# Patient Record
Sex: Male | Born: 1990 | Race: White | Hispanic: No | Marital: Single | Smoking: Never smoker
Health system: Southern US, Community
[De-identification: ages and names within clinical notes are randomized; demographics above are authoritative.]

## PROBLEM LIST (undated history)

## (undated) ENCOUNTER — Emergency Department (HOSPITAL_BASED_OUTPATIENT_CLINIC_OR_DEPARTMENT_OTHER): Payer: BLUE CROSS/BLUE SHIELD | Source: Home / Self Care

---
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
## 2016-03-10 ENCOUNTER — Encounter: Payer: Self-pay | Admitting: Podiatry

## 2016-03-10 ENCOUNTER — Ambulatory Visit (INDEPENDENT_AMBULATORY_CARE_PROVIDER_SITE_OTHER): Payer: Commercial Managed Care - HMO | Admitting: Podiatry

## 2016-03-10 ENCOUNTER — Ambulatory Visit (INDEPENDENT_AMBULATORY_CARE_PROVIDER_SITE_OTHER): Payer: Commercial Managed Care - HMO

## 2016-03-10 VITALS — BP 122/79 | HR 72 | Resp 16 | Ht 67.0 in | Wt 210.0 lb

## 2016-03-10 DIAGNOSIS — M79671 Pain in right foot: Secondary | ICD-10-CM

## 2016-03-10 DIAGNOSIS — M779 Enthesopathy, unspecified: Secondary | ICD-10-CM

## 2016-03-10 DIAGNOSIS — L84 Corns and callosities: Secondary | ICD-10-CM | POA: Diagnosis not present

## 2016-03-10 MED ORDER — TRIAMCINOLONE ACETONIDE 10 MG/ML IJ SUSP
10.0000 mg | Freq: Once | INTRAMUSCULAR | Status: AC
  Administered 2016-03-10: 10 mg

## 2016-03-10 NOTE — Progress Notes (Signed)
   Subjective:    Patient ID: Eric Shepard, male    DOB: 1991/02/09, 25 y.o.   MRN: [id number]  HPI Chief Complaint  Patient presents with  . Foot Pain    Right foot; lateral side; pt stated, "Slipped off a rock 2 weeks ago and hit side of foot"  . Painful lesions    Right foot; lateral side (below 5th toe); & plantar forefoot-below 2nd toe; pt stated, "Was dianosed years ago with having a sweat gland"      Review of Systems  All other systems reviewed and are negative.      Objective:   Physical Exam        Assessment & Plan:

## 2016-03-13 NOTE — Progress Notes (Signed)
Subjective:     Patient ID: Eric HartChristopher P Vanduyne, male   DOB: 08-24-1991, 25 y.o.   MRN: [id number]  HPI patient points with a painful lesion on the plantar aspect of the right fifth metatarsal with inflammation noted and fluid buildup around it. It is swollen and it is sore when pressed and at times makes it hard to walk. Has only become flared up recently again   Review of Systems  All other systems reviewed and are negative.      Objective:   Physical Exam  Constitutional: He is oriented to person, place, and time.  Cardiovascular: Intact distal pulses.   Musculoskeletal: Normal range of motion.  Neurological: He is oriented to person, place, and time.  Skin: Skin is warm.  Nursing note and vitals reviewed.  neurovascular status intact muscle strength adequate range of motion within normal limits with patient found to have inflammation and fluid around the fifth metatarsal head right that's painful when pressed and makes walking difficult. Patient states that this is been ongoing for the last few weeks and is had history of this problem. Patient's found to have good digital perfusion and is well oriented 3     Assessment:     Inflammatory capsulitis fifth MPJ right with pain and keratotic lesion formation    Plan:     H&P and x-rays reviewed with patient. Today I did careful injection around the fifth MPJ 3 mg Kenalog 5 mg Xylocaine and then after effective numbness debrided the lesion fully and applied sterile dressing. Reappoint to recheck as symptoms indicate  X-rays did not indicate any signs stress fracture arthritis with moderate plantar flexion fifth metatarsal right

## 2016-04-23 ENCOUNTER — Emergency Department (HOSPITAL_BASED_OUTPATIENT_CLINIC_OR_DEPARTMENT_OTHER)
Admission: EM | Admit: 2016-04-23 | Discharge: 2016-04-23 | Disposition: A | Discharge status: Home / Self Care | Payer: Commercial Managed Care - HMO | Attending: Emergency Medicine | Admitting: Emergency Medicine

## 2016-04-23 ENCOUNTER — Encounter (HOSPITAL_BASED_OUTPATIENT_CLINIC_OR_DEPARTMENT_OTHER): Payer: Self-pay | Admitting: Emergency Medicine

## 2016-04-23 DIAGNOSIS — Y999 Unspecified external cause status: Secondary | ICD-10-CM | POA: Diagnosis not present

## 2016-04-23 DIAGNOSIS — Y9389 Activity, other specified: Secondary | ICD-10-CM | POA: Insufficient documentation

## 2016-04-23 DIAGNOSIS — W57XXXA Bitten or stung by nonvenomous insect and other nonvenomous arthropods, initial encounter: Secondary | ICD-10-CM | POA: Insufficient documentation

## 2016-04-23 DIAGNOSIS — Y929 Unspecified place or not applicable: Secondary | ICD-10-CM | POA: Insufficient documentation

## 2016-04-23 DIAGNOSIS — S30860A Insect bite (nonvenomous) of lower back and pelvis, initial encounter: Secondary | ICD-10-CM | POA: Insufficient documentation

## 2016-04-23 MED ORDER — DOXYCYCLINE HYCLATE 100 MG PO TABS
200.0000 mg | ORAL_TABLET | Freq: Once | ORAL | Status: AC
  Administered 2016-04-23: 200 mg via ORAL
  Filled 2016-04-23: qty 2

## 2016-04-23 NOTE — Discharge Instructions (Signed)
You had a tick removed from your back today. The area did not look infected. If you develop any symptoms such as fatigue, headache, fevers, body aches, rashes please return to the emergency department or see your primary care provider. You were given doxycycline today in hopes of preventing any tickborne illness. Please return to emergency department if you develop any new or worsening symptoms.   Tick Bite Information Ticks are insects that attach themselves to the skin and draw blood for food. There are various types of ticks. Common types include wood ticks and deer ticks. Most ticks live in shrubs and grassy areas. Ticks can climb onto your body when you make contact with leaves or grass where the tick is waiting. The most common places on the body for ticks to attach themselves are the scalp, neck, armpits, waist, and groin. Most tick bites are harmless, but sometimes ticks carry germs that cause diseases. These germs can be spread to a person during the tick's feeding process. The chance of a disease spreading through a tick bite depends on:   The type of tick.  Time of year.   How long the tick is attached.   Geographic location.  HOW CAN YOU PREVENT TICK BITES? Take these steps to help prevent tick bites when you are outdoors:  Wear protective clothing. Long sleeves and long pants are best.   Wear white clothes so you can see ticks more easily.  Tuck your pant legs into your socks.   If walking on a trail, stay in the middle of the trail to avoid brushing against bushes.  Avoid walking through areas with long grass.  Put insect repellent on all exposed skin and along boot tops, pant legs, and sleeve cuffs.   Check clothing, hair, and skin repeatedly and before going inside.   Brush off any ticks that are not attached.  Take a shower or bath as soon as possible after being outdoors.  WHAT IS THE PROPER WAY TO REMOVE A TICK? Ticks should be removed as soon as  possible to help prevent diseases caused by tick bites. 1. If latex gloves are available, put them on before trying to remove a tick.  2. Using fine-point tweezers, grasp the tick as close to the skin as possible. You may also use curved forceps or a tick removal tool. Grasp the tick as close to its head as possible. Avoid grasping the tick on its body. 3. Pull gently with steady upward pressure until the tick lets go. Do not twist the tick or jerk it suddenly. This may break off the tick's head or mouth parts. 4. Do not squeeze or crush the tick's body. This could force disease-carrying fluids from the tick into your body.  5. After the tick is removed, wash the bite area and your hands with soap and water or other disinfectant such as alcohol. 6. Apply a small amount of antiseptic cream or ointment to the bite site.  7. Wash and disinfect any instruments that were used.  Do not try to remove a tick by applying a hot match, petroleum jelly, or fingernail polish to the tick. These methods do not work and may increase the chances of disease being spread from the tick bite.  WHEN SHOULD YOU SEEK MEDICAL CARE? Contact your health care provider if you are unable to remove a tick from your skin or if a part of the tick breaks off and is stuck in the skin.  After a tick bite,  you need to be aware of signs and symptoms that could be related to diseases spread by ticks. Contact your health care provider if you develop any of the following in the days or weeks after the tick bite:  Unexplained fever.  Rash. A circular rash that appears days or weeks after the tick bite may indicate the possibility of Lyme disease. The rash may resemble a target with a bull's-eye and may occur at a different part of your body than the tick bite.  Redness and swelling in the area of the tick bite.   Tender, swollen lymph glands.   Diarrhea.   Weight loss.   Cough.   Fatigue.   Muscle, joint, or bone pain.    Abdominal pain.   Headache.   Lethargy or a change in your level of consciousness.  Difficulty walking or moving your legs.   Numbness in the legs.   Paralysis.  Shortness of breath.   Confusion.   Repeated vomiting.    This information is not intended to replace advice given to you by your health care provider. Make sure you discuss any questions you have with your health care provider.   Document Released: 10/27/2000 Document Revised: 11/20/2014 Document Reviewed: 04/09/2013 Elsevier Interactive Patient Education [employer]

## 2016-04-23 NOTE — ED Provider Notes (Signed)
CSN: [id number]     Arrival date & time 04/23/16  1814 History   First MD Initiated Contact with Patient 04/23/16 1844     Chief Complaint  Patient presents with  . Tick Removal     (Consider location/radiation/quality/duration/timing/severity/associated sxs/prior Treatment) HPI Comments: Patient is a previously healthy 25 year old male who presents for tick removal. Patient stated he noticed a tick on his back today. Patient believes the tick has been on him since yesterday because he was working outside. The tick was engorged. His mother tried to pull out the tick with tweezers and was able to removed the majority except a remaining mouth part that will not come out. Patient denies pain to the area. Patient denies fever, chills, fatigue, chest pain, shortness of breath, abdominal pain, back pain, dysuria, rashes, or headaches.   The history is provided by the patient and a parent.    History reviewed. No pertinent past medical history. History reviewed. No pertinent past surgical history. History reviewed. No pertinent family history. Social History  Substance Use Topics  . Smoking status: Never Smoker   . Smokeless tobacco: None  . Alcohol Use: None    Review of Systems  Constitutional: Negative for fever, chills and fatigue.  HENT: Negative for facial swelling and sore throat.   Respiratory: Negative for shortness of breath.   Cardiovascular: Negative for chest pain.  Gastrointestinal: Negative for nausea, vomiting and abdominal pain.  Genitourinary: Negative for dysuria.  Musculoskeletal: Negative for back pain.  Skin: Positive for wound. Negative for rash.  Neurological: Negative for headaches.  Psychiatric/Behavioral: The patient is not nervous/anxious.       Allergies  Review of patient's allergies indicates no known allergies.  Home Medications   Prior to Admission medications   Medication Sig Start Date End Date Taking? Authorizing Provider  loratadine  (CLARITIN) 10 MG tablet  08/27/13   Historical Provider, MD   BP 123/89 mmHg  Pulse 76  Temp(Src) 97.9 F (36.6 C) (Oral)  Resp 18  Ht 5\' 7"  (1.702 m)  Wt 96.163 kg  BMI 33.20 kg/m2  SpO2 97% Physical Exam  Constitutional: He appears well-developed and well-nourished. No distress.  HENT:  Head: Normocephalic and atraumatic.  Mouth/Throat: Oropharynx is clear and moist. No oropharyngeal exudate.  Eyes: Conjunctivae are normal. Pupils are equal, round, and reactive to light. Right eye exhibits no discharge. Left eye exhibits no discharge. No scleral icterus.  Neck: Normal range of motion. Neck supple. No thyromegaly present.  Cardiovascular: Normal rate, regular rhythm, normal heart sounds and intact distal pulses.  Exam reveals no gallop and no friction rub.   No murmur heard. Pulmonary/Chest: Effort normal and breath sounds normal. No stridor. No respiratory distress. He has no wheezes. He has no rales.  Abdominal: Soft. Bowel sounds are normal. He exhibits no distension. There is no tenderness. There is no rebound and no guarding.  Musculoskeletal: He exhibits no edema.  Lymphadenopathy:    He has no cervical adenopathy.  Neurological: He is alert. Coordination normal.  Skin: Skin is warm and dry. No rash noted. He is not diaphoretic. No pallor.  Punctate size black, hard fleck in mid back, mild surrounding erythema and edema; no bulls eye or other rashes noted  Psychiatric: He has a normal mood and affect.  Nursing note and vitals reviewed.   ED Course  .Foreign Body Removal Date/Time: 04/23/2016 8:06 PM Performed by: Emi HolesLAW, Abhishek Levesque M Authorized by: Emi HolesLAW, Recie Cirrincione M Consent: Verbal consent obtained. Consent given  by: patient Patient identity confirmed: verbally with patient Intake: back. Anesthesia method: None. Patient sedated: no Patient restrained: no Patient cooperative: yes Complexity: simple 1 objects recovered. Objects recovered: small mouth part of  tick Post-procedure assessment: foreign body removed Patient tolerance: Patient tolerated the procedure well with no immediate complications Comments: I prepped the area with iodine and removed the mouth parts with a tweezer and the tip of a 23-gauge needle. The patient tolerated the procedure well without any anesthetic necessary. Very scant, pinpoint bleeding from the area, controlled quickly. I cleaned the area with alcohol following procedure and applied bacitracin and Band-Aid.   (including critical care time) Labs Review Labs Reviewed - No data to display  Imaging Review No results found. I have personally reviewed and evaluated these images and lab results as part of my medical decision-making.   EKG Interpretation None      MDM   Tick part removed successfully without complication as indicted above. No signs of infection at this time. After discussion with patient and Dr. Madilyn Hook, I treated patient with a prophylactic dose of doxycycline prior to discharge. Patient advised to follow up with PCP if he develops any symptoms such as fatigue, fevers, body aches, headaches, or any other symptom out of the ordinary. Patient understands and is in agreement with plan. Patient vitals stable throughout ED course and discharged in satisfactory condition.  Final diagnoses:  Tick bite       Emi Holes, PA-C 04/24/16 1126  Tilden Fossa, MD 04/26/16 [phone]

## 2016-04-23 NOTE — ED Notes (Signed)
Pt reports that he noticed tick on back this afternoon, working out side yesterday so patient assumes it has been there greater than 24 hr, attempted to remove but head came of in skin

## 2018-08-04 ENCOUNTER — Encounter (HOSPITAL_BASED_OUTPATIENT_CLINIC_OR_DEPARTMENT_OTHER): Payer: Self-pay | Admitting: Emergency Medicine

## 2018-08-04 ENCOUNTER — Emergency Department (HOSPITAL_BASED_OUTPATIENT_CLINIC_OR_DEPARTMENT_OTHER)
Admission: EM | Admit: 2018-08-04 | Discharge: 2018-08-04 | Disposition: A | Discharge status: Home / Self Care | Payer: BLUE CROSS/BLUE SHIELD | Attending: Emergency Medicine | Admitting: Emergency Medicine

## 2018-08-04 ENCOUNTER — Other Ambulatory Visit: Payer: Self-pay

## 2018-08-04 DIAGNOSIS — S61412A Laceration without foreign body of left hand, initial encounter: Secondary | ICD-10-CM | POA: Insufficient documentation

## 2018-08-04 DIAGNOSIS — Z23 Encounter for immunization: Secondary | ICD-10-CM | POA: Insufficient documentation

## 2018-08-04 DIAGNOSIS — Z79899 Other long term (current) drug therapy: Secondary | ICD-10-CM | POA: Diagnosis not present

## 2018-08-04 DIAGNOSIS — Y929 Unspecified place or not applicable: Secondary | ICD-10-CM | POA: Insufficient documentation

## 2018-08-04 DIAGNOSIS — Y998 Other external cause status: Secondary | ICD-10-CM | POA: Insufficient documentation

## 2018-08-04 DIAGNOSIS — Y9389 Activity, other specified: Secondary | ICD-10-CM | POA: Insufficient documentation

## 2018-08-04 DIAGNOSIS — W268XXA Contact with other sharp object(s), not elsewhere classified, initial encounter: Secondary | ICD-10-CM | POA: Diagnosis not present

## 2018-08-04 MED ORDER — LIDOCAINE-EPINEPHRINE 2 %-1:200000 IJ SOLN
10.0000 mL | Freq: Once | INTRAMUSCULAR | Status: AC
Start: 2018-08-04 — End: 2018-08-04
  Administered 2018-08-04: 10 mL
  Filled 2018-08-04 (×2): qty 10

## 2018-08-04 MED ORDER — TETANUS-DIPHTH-ACELL PERTUSSIS 5-2.5-18.5 LF-MCG/0.5 IM SUSP
0.5000 mL | Freq: Once | INTRAMUSCULAR | Status: AC
  Administered 2018-08-04: 0.5 mL via INTRAMUSCULAR
  Filled 2018-08-04: qty 0.5

## 2018-08-04 NOTE — ED Provider Notes (Signed)
[facility] EMERGENCY DEPARTMENT Provider Note   CSN: [id number] Arrival date & time: 08/04/18  0645     History   Chief Complaint Chief Complaint  Patient presents with  . Laceration    HPI Eric Shepard is a 27 y.o. male.  The history is provided by the patient.  Laceration   The incident occurred less than 1 hour ago. The laceration is located on the left hand. The laceration is 1 cm in size. The laceration mechanism was a a razor. The pain is at a severity of 0/10. The pain is mild. The pain has been constant since onset. He reports no foreign bodies present. His tetanus status is out of date.    History reviewed. No pertinent past medical history.  There are no active problems to display for this patient.   History reviewed. No pertinent surgical history.      Home Medications    Prior to Admission medications   Medication Sig Start Date End Date Taking? Authorizing Provider  loratadine (CLARITIN) 10 MG tablet  08/27/13   [provider]    Family History History reviewed. No pertinent family history.  Social History Social History   Tobacco Use  . Smoking status: Never Smoker  . Smokeless tobacco: Never Used  Substance Use Topics  . Alcohol use: Never    Alcohol/week: 0.0 standard drinks    Frequency: Never  . Drug use: Never     Allergies   Patient has no known allergies.   Review of Systems Review of Systems  All other systems reviewed and are negative.    Physical Exam Updated Vital Signs BP (!) 147/86 (BP Location: Right Arm)   Pulse 88   Temp 98 F (36.7 C) (Oral)   Resp 18   Ht 5\' 7"  (1.702 m)   Wt 95.3 kg   SpO2 100%   BMI 32.89 kg/m   Physical Exam  Constitutional: He is oriented to person, place, and time. He appears well-developed and well-nourished. No distress.  HENT:  Head: Normocephalic.  Eyes: Pupils are equal, round, and reactive to light.  Cardiovascular: Normal rate.    Pulmonary/Chest: Effort normal.  Musculoskeletal:       Hands: Neurological: He is alert and oriented to person, place, and time. He has normal strength. No sensory deficit.  Skin: Skin is warm and dry. Capillary refill takes less than 2 seconds.  Psychiatric: He has a normal mood and affect. His behavior is normal.  Nursing note and vitals reviewed.    ED Treatments / Results  Labs (all labs ordered are listed, but only abnormal results are displayed) Labs Reviewed - No data to display  EKG None  Radiology No results found.  Procedures Procedures (including critical care time) LACERATION REPAIR Performed by: [employer]Whitney Codi Kertz Authorized by: Gwyneth SproutWhitney Chaselynn Kepple Consent: Verbal consent obtained. Risks and benefits: risks, benefits and alternatives were discussed Consent given by: patient Patient identity confirmed: provided demographic data Prepped and Draped in normal sterile fashion Wound explored  Laceration Location: left palm  Laceration Length: 1cm  No Foreign Bodies seen or palpated  Anesthesia: local infiltration  Local anesthetic: lidocaine 2% with epinephrine  Anesthetic total: 3 ml  Irrigation method: syringe Amount of cleaning: standard  Skin closure: 4.0 Prolene  Number of sutures: 3  Technique: Simple interrupted  Patient tolerance: Patient tolerated the procedure well with no immediate complications.  Medications Ordered in ED Medications  lidocaine-EPINEPHrine (XYLOCAINE W/EPI) 2 %-1:200000 (PF) injection 10 mL (10  mLs Infiltration Given 08/04/18 0709)  Tdap (BOOSTRIX) injection 0.5 mL (0.5 mLs Intramuscular Given 08/04/18 0709)     Initial Impression / Assessment and Plan / ED Course  I have reviewed the triage vital signs and the nursing notes.  Pertinent labs & imaging results that were available during my care of the patient were reviewed by me and considered in my medical decision making (see chart for details).     Patient with an  uncomplicated laceration to the left palmar surface of his hand.  Neurovascularly intact.  Tetanus shot was updated.  Patient was discharged home.  Final Clinical Impressions(s) / ED Diagnoses   Final diagnoses:  Laceration of left hand without foreign body, initial encounter    ED Discharge Orders    None       Gwyneth Sprout, MD 08/04/18 [phone]

## 2018-08-04 NOTE — ED Triage Notes (Signed)
Pt had a small laceration on his left hand for around 40 mins.

## 2019-08-13 ENCOUNTER — Other Ambulatory Visit: Payer: Self-pay | Admitting: Gerontology

## 2019-08-13 ENCOUNTER — Ambulatory Visit (INDEPENDENT_AMBULATORY_CARE_PROVIDER_SITE_OTHER): Payer: BC Managed Care – PPO

## 2019-08-13 ENCOUNTER — Other Ambulatory Visit: Payer: Self-pay

## 2019-08-13 DIAGNOSIS — S99912A Unspecified injury of left ankle, initial encounter: Secondary | ICD-10-CM

## 2020-03-15 IMAGING — DX DG ANKLE COMPLETE 3+V*L*
3 series · 3 of 3 positions shown · non-contrast
Comparison: None.

CLINICAL DATA: Jumped out of truck today with rolling left ankle
injury and resulting pain and swelling.

EXAM:
LEFT ANKLE COMPLETE - 3+ VIEW

[ankle ap]
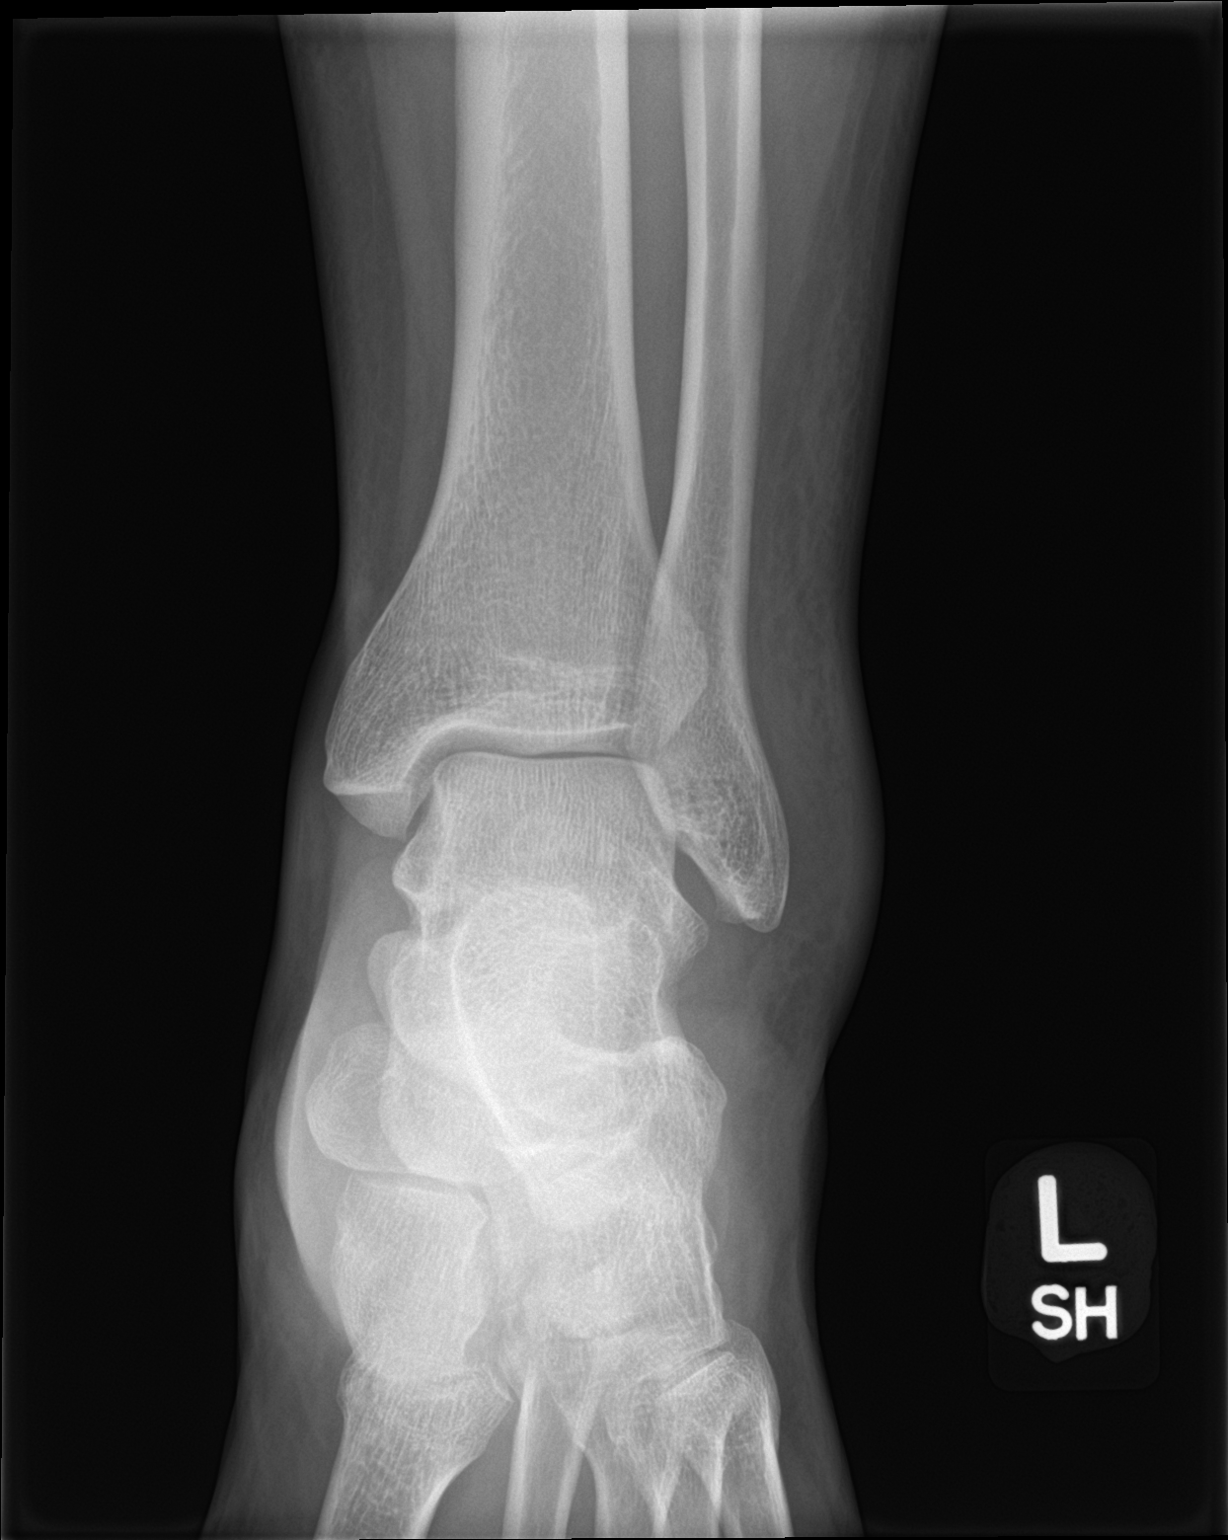

[ankle obl]
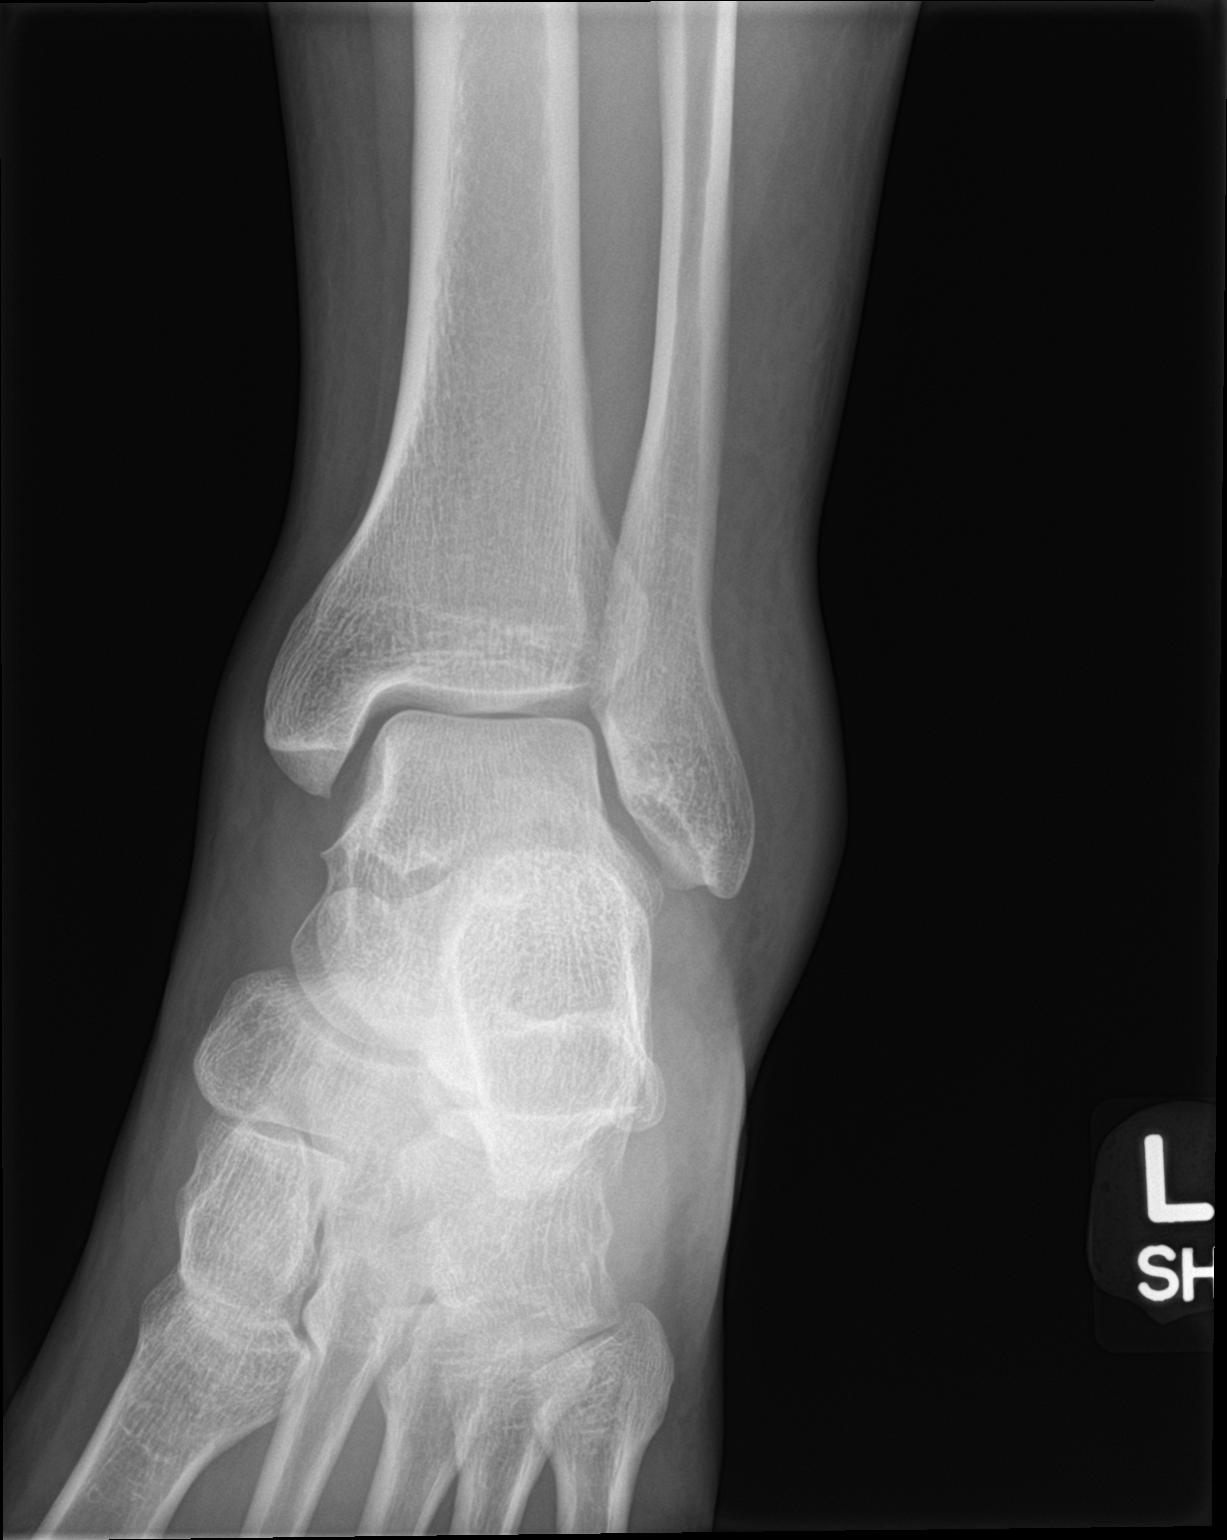

[ankle lat]
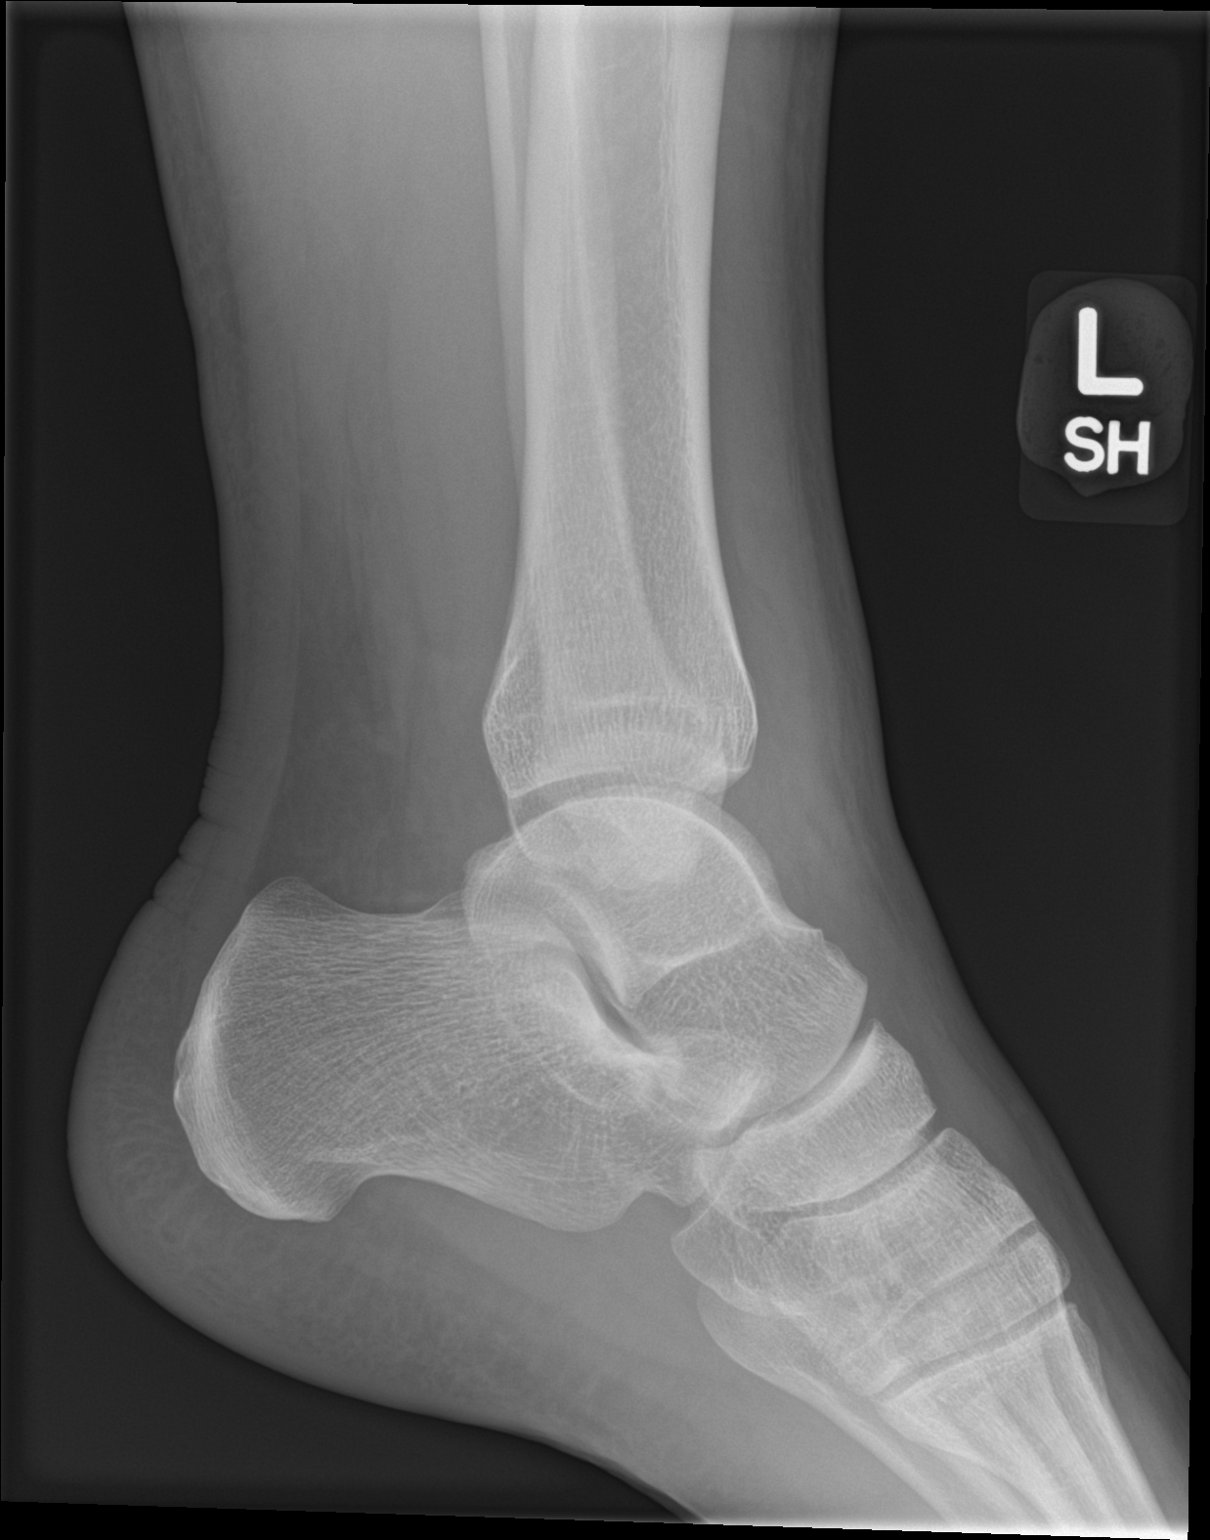

[3 of 3 positions shown; findings below may reference images not displayed]

FINDINGS: Regional soft tissue swelling laterally. Ankle mortise is normal. No
acute fracture or dislocation.
IMPRESSION: No acute fracture.
